# Patient Record
Sex: Male | Born: 1975 | Hispanic: Yes | Marital: Single
Health system: Southern US, Community
[De-identification: ages and names within clinical notes are randomized; demographics above are authoritative.]

---

## 2014-02-28 ENCOUNTER — Encounter (HOSPITAL_COMMUNITY): Payer: Self-pay | Admitting: Emergency Medicine

## 2014-02-28 ENCOUNTER — Emergency Department (HOSPITAL_COMMUNITY)
Admission: EM | Admit: 2014-02-28 | Discharge: 2014-02-28 | Disposition: A | Discharge status: Home / Self Care | Payer: No Typology Code available for payment source | Attending: Emergency Medicine | Admitting: Emergency Medicine

## 2014-02-28 ENCOUNTER — Emergency Department (HOSPITAL_COMMUNITY): Payer: No Typology Code available for payment source

## 2014-02-28 DIAGNOSIS — S46909A Unspecified injury of unspecified muscle, fascia and tendon at shoulder and upper arm level, unspecified arm, initial encounter: Secondary | ICD-10-CM | POA: Insufficient documentation

## 2014-02-28 DIAGNOSIS — S0993XA Unspecified injury of face, initial encounter: Secondary | ICD-10-CM | POA: Diagnosis not present

## 2014-02-28 DIAGNOSIS — Y9389 Activity, other specified: Secondary | ICD-10-CM | POA: Diagnosis not present

## 2014-02-28 DIAGNOSIS — Y92410 Unspecified street and highway as the place of occurrence of the external cause: Secondary | ICD-10-CM | POA: Diagnosis not present

## 2014-02-28 DIAGNOSIS — M549 Dorsalgia, unspecified: Secondary | ICD-10-CM

## 2014-02-28 DIAGNOSIS — M25511 Pain in right shoulder: Secondary | ICD-10-CM

## 2014-02-28 DIAGNOSIS — S4980XA Other specified injuries of shoulder and upper arm, unspecified arm, initial encounter: Secondary | ICD-10-CM | POA: Diagnosis not present

## 2014-02-28 DIAGNOSIS — S199XXA Unspecified injury of neck, initial encounter: Secondary | ICD-10-CM

## 2014-02-28 DIAGNOSIS — IMO0002 Reserved for concepts with insufficient information to code with codable children: Secondary | ICD-10-CM | POA: Insufficient documentation

## 2014-02-28 LAB — ETHANOL: Alcohol, Ethyl (B): <11 mg/dL (ref 0–11)

## 2014-02-28 LAB — PROTIME-INR
INR: 1.09 (ref 0.00–1.49)
PROTHROMBIN TIME: 14.1 s (ref 11.6–15.2)

## 2014-02-28 LAB — CBC
HCT: 36.5 % — ABNORMAL LOW (ref 39.0–52.0)
Hemoglobin: 12.4 g/dL — ABNORMAL LOW (ref 13.0–17.0)
MCH: 28.5 pg (ref 26.0–34.0)
MCHC: 34 g/dL (ref 30.0–36.0)
MCV: 83.9 fL (ref 78.0–100.0)
Platelets: 279 10*3/uL (ref 150–400)
RBC: 4.35 MIL/uL (ref 4.22–5.81)
RDW: 13.6 % (ref 11.5–15.5)
WBC: 10.3 10*3/uL (ref 4.0–10.5)

## 2014-02-28 LAB — COMPREHENSIVE METABOLIC PANEL
ALK PHOS: 90 U/L (ref 39–117)
ALT: 22 U/L (ref 0–53)
ANION GAP: 14 (ref 5–15)
AST: 25 U/L (ref 0–37)
Albumin: 3.9 g/dL (ref 3.5–5.2)
BUN: 30 mg/dL — AB (ref 6–23)
CHLORIDE: 104 meq/L (ref 96–112)
CO2: 22 meq/L (ref 19–32)
Calcium: 9.2 mg/dL (ref 8.4–10.5)
Creatinine, Ser: 0.53 mg/dL (ref 0.50–1.35)
GLUCOSE: 96 mg/dL (ref 70–99)
POTASSIUM: 3.6 meq/L — AB (ref 3.7–5.3)
Sodium: 140 mEq/L (ref 137–147)
Total Bilirubin: 0.5 mg/dL (ref 0.3–1.2)
Total Protein: 7.8 g/dL (ref 6.0–8.3)

## 2014-02-28 MED ORDER — MORPHINE SULFATE 4 MG/ML IJ SOLN
4.0000 mg | Freq: Once | INTRAMUSCULAR | Status: AC
  Administered 2014-02-28: 4 mg via INTRAVENOUS
  Filled 2014-02-28: qty 1

## 2014-02-28 MED ORDER — CYCLOBENZAPRINE HCL 10 MG PO TABS: 10.0000 mg | ORAL_TABLET | Freq: Three times a day (TID) | ORAL | Status: AC | PRN

## 2014-02-28 MED ORDER — OXYCODONE-ACETAMINOPHEN 5-325 MG PO TABS: 1.0000 | ORAL_TABLET | Freq: Four times a day (QID) | ORAL | Status: AC | PRN

## 2014-02-28 MED ORDER — NAPROXEN 500 MG PO TABS: 500.0000 mg | ORAL_TABLET | Freq: Two times a day (BID) | ORAL | Status: AC | PRN
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
## 2014-02-28 NOTE — ED Notes (Signed)
Pt. Given scrubs to change into and water. Pt. Tolerating fluid at this time.

## 2014-02-28 NOTE — ED Notes (Signed)
Pt monitored by pulse ox, bp cuff, and 5-lead. 
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
## 2014-02-28 NOTE — ED Notes (Signed)
Pt monitored by pulse ox, bp cuff, and 12-lead. 

## 2014-02-28 NOTE — ED Notes (Signed)
Pt. Transported to xray 
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
## 2014-03-01 NOTE — ED Provider Notes (Signed)
Medical screening examination/treatment/procedure(s) were performed by non-physician practitioner and as supervising physician I was immediately available for consultation/collaboration.   Anelisse Jacobson T Treveon Bourcier, MD 03/01/14 2247 
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
## 2022-07-13 DEATH — deceased
